# Patient Record
Sex: Female | Born: 1950 | Race: White | Hispanic: No | Marital: Married | Smoking: Never smoker
Health system: Southern US, Community
[De-identification: ages and names within clinical notes are randomized; demographics above are authoritative.]

## PROBLEM LIST (undated history)

## (undated) DIAGNOSIS — I10 Essential (primary) hypertension: Secondary | ICD-10-CM

## (undated) DIAGNOSIS — M790 Rheumatism, unspecified: Secondary | ICD-10-CM

## (undated) DIAGNOSIS — J45909 Unspecified asthma, uncomplicated: Secondary | ICD-10-CM

## (undated) DIAGNOSIS — M1990 Unspecified osteoarthritis, unspecified site: Secondary | ICD-10-CM

---

## 2015-12-02 ENCOUNTER — Encounter (HOSPITAL_COMMUNITY): Payer: Self-pay | Admitting: Student

## 2015-12-02 ENCOUNTER — Inpatient Hospital Stay (HOSPITAL_COMMUNITY): Payer: Medicare Other

## 2015-12-02 ENCOUNTER — Inpatient Hospital Stay (HOSPITAL_COMMUNITY)
Admission: AD | Admit: 2015-12-02 | Discharge: 2015-12-02 | Disposition: A | Discharge status: Home / Self Care | Payer: Medicare Other | Source: Ambulatory Visit | Attending: Emergency Medicine | Admitting: Emergency Medicine

## 2015-12-02 DIAGNOSIS — Z79899 Other long term (current) drug therapy: Secondary | ICD-10-CM | POA: Insufficient documentation

## 2015-12-02 DIAGNOSIS — J45901 Unspecified asthma with (acute) exacerbation: Secondary | ICD-10-CM | POA: Insufficient documentation

## 2015-12-02 DIAGNOSIS — R079 Chest pain, unspecified: Secondary | ICD-10-CM | POA: Diagnosis present

## 2015-12-02 DIAGNOSIS — M1990 Unspecified osteoarthritis, unspecified site: Secondary | ICD-10-CM | POA: Insufficient documentation

## 2015-12-02 DIAGNOSIS — I10 Essential (primary) hypertension: Secondary | ICD-10-CM | POA: Diagnosis not present

## 2015-12-02 DIAGNOSIS — Z7951 Long term (current) use of inhaled steroids: Secondary | ICD-10-CM | POA: Diagnosis not present

## 2015-12-02 HISTORY — DX: Essential (primary) hypertension: I10

## 2015-12-02 HISTORY — DX: Unspecified asthma, uncomplicated: J45.909

## 2015-12-02 HISTORY — DX: Unspecified osteoarthritis, unspecified site: M19.90

## 2015-12-02 HISTORY — DX: Rheumatism, unspecified: M79.0

## 2015-12-02 LAB — BASIC METABOLIC PANEL
ANION GAP: 14 (ref 5–15)
BUN: 7 mg/dL (ref 6–20)
CHLORIDE: 103 mmol/L (ref 101–111)
CO2: 24 mmol/L (ref 22–32)
Calcium: 9.2 mg/dL (ref 8.9–10.3)
Creatinine, Ser: 1.06 mg/dL — ABNORMAL HIGH (ref 0.44–1.00)
GFR calc non Af Amer: 53 mL/min — ABNORMAL LOW (ref >60)
GLUCOSE: 105 mg/dL — AB (ref 65–99)
POTASSIUM: 3.7 mmol/L (ref 3.5–5.1)
Sodium: 141 mmol/L (ref 135–145)

## 2015-12-02 LAB — CBC WITH DIFFERENTIAL/PLATELET
BASOS ABS: 0 10*3/uL (ref 0.0–0.1)
BASOS PCT: 1 %
Eosinophils Absolute: 0.1 10*3/uL (ref 0.0–0.7)
Eosinophils Relative: 2 %
HEMATOCRIT: 38.5 % (ref 36.0–46.0)
HEMOGLOBIN: 12.3 g/dL (ref 12.0–15.0)
LYMPHS ABS: 0.8 10*3/uL (ref 0.7–4.0)
LYMPHS PCT: 16 %
MCH: 27.7 pg (ref 26.0–34.0)
MCHC: 31.9 g/dL (ref 30.0–36.0)
MCV: 86.7 fL (ref 78.0–100.0)
MONO ABS: 0.6 10*3/uL (ref 0.1–1.0)
MONOS PCT: 11 %
NEUTROS ABS: 3.5 10*3/uL (ref 1.7–7.7)
Neutrophils Relative %: 70 %
Platelets: 235 10*3/uL (ref 150–400)
RBC: 4.44 MIL/uL (ref 3.87–5.11)
RDW: 14.3 % (ref 11.5–15.5)
WBC: 5 10*3/uL (ref 4.0–10.5)

## 2015-12-02 LAB — I-STAT TROPONIN, ED
TROPONIN I, POC: 0 ng/mL (ref 0.00–0.08)
Troponin i, poc: 0 ng/mL (ref 0.00–0.08)

## 2015-12-02 MED ORDER — NITROGLYCERIN 0.4 MG SL SUBL
0.4000 mg | SUBLINGUAL_TABLET | SUBLINGUAL | Status: DC | PRN
  Administered 2015-12-02 (×2): 0.4 mg via SUBLINGUAL
  Filled 2015-12-02: qty 1

## 2015-12-02 MED ORDER — ASPIRIN 81 MG PO CHEW
324.0000 mg | CHEWABLE_TABLET | Freq: Once | ORAL | Status: AC
  Administered 2015-12-02: 324 mg via ORAL
  Filled 2015-12-02: qty 4

## 2015-12-02 MED ORDER — SODIUM CHLORIDE 0.9 % IV SOLN: 999.0000 mL | INTRAVENOUS | Status: DC

## 2015-12-02 MED ORDER — CLOPIDOGREL BISULFATE 75 MG PO TABS
300.0000 mg | ORAL_TABLET | Freq: Once | ORAL | Status: AC
  Administered 2015-12-02: 300 mg via ORAL
  Filled 2015-12-02: qty 4

## 2015-12-02 NOTE — Discharge Instructions (Signed)
Please read and follow all provided instructions.  Your diagnoses today include:  1. Chest pain, unspecified chest pain type     Tests performed today include:  An EKG of your heart  A chest x-ray  Cardiac enzymes - a blood test for heart muscle damage  Blood counts and electrolytes  Vital signs. See below for your results today.   Medications prescribed:   None  Take any prescribed medications only as directed.  Follow-up instructions: Please follow-up with your primary care provider as soon as you can for further evaluation of your symptoms.   Return instructions:  SEEK IMMEDIATE MEDICAL ATTENTION IF:  You have severe chest pain, especially if the pain is crushing or pressure-like and spreads to the arms, back, neck, or jaw, or if you have sweating, nausea (feeling sick to your stomach), or shortness of breath. THIS IS AN EMERGENCY. Don't wait to see if the pain will go away. Get medical help at once. Call 911 or 0 (operator). DO NOT drive yourself to the hospital.   Your chest pain gets worse and does not go away with rest.   You have an attack of chest pain lasting longer than usual, despite rest and treatment with the medications your caregiver has prescribed.   You wake from sleep with chest pain or shortness of breath.  You feel dizzy or faint.  You have chest pain not typical of your usual pain for which you originally saw your caregiver.   You have any other emergent concerns regarding your health.  Additional Information: Chest pain comes from many different causes. Your caregiver has diagnosed you as having chest pain that is not specific for one problem, but does not require admission.  You are at low risk for an acute heart condition or other serious illness.   Your vital signs today were: BP 135/68 mmHg   Pulse 61   Temp(Src) 98.8 F (37.1 C) (Oral)   Resp 12   SpO2 95% If your blood pressure (BP) was elevated above 135/85 this visit, please have this  repeated by your doctor within one month. --------------

## 2015-12-02 NOTE — MAU Provider Note (Signed)
History     CSN: [id number]  Arrival date and time: 12/02/15 1456   None        Chief Complaint  Patient presents with  . Chest Pain   HPI Comments: Marissa LarsenBarbara Yates is a 65 y.o. Female who presents for chest pain. Reports feeling of heartburn 2 days ago. Since this morning feels like has "heaviness" on chest with discomfort that radiates down left arm and under her chin.   Chest Pain  This is a new problem. The current episode started today. The onset quality is gradual. The problem occurs constantly. The problem has been gradually worsening. The pain is present in the substernal region. The quality of the pain is described as pressure ("heavy"). The pain radiates to the left arm (under chin). Associated symptoms include shortness of breath (on exertion). Pertinent negatives include no abdominal pain, cough, diaphoresis, dizziness, fever, headaches, irregular heartbeat, leg pain, nausea, near-syncope, numbness, palpitations or syncope. She has tried antacids for the symptoms. The treatment provided no relief. Risk factors include being elderly.  Her past medical history is significant for hypertension.  Pertinent negatives for past medical history include no congenital heart disease, no diabetes and no MI. Prior diagnostic workup includes echocardiogram.       Past Medical History  Diagnosis Date  . Asthma   . Arthritis   . Hypertension   . Rheumatism     left wrist, left hand, right wrist    No past surgical history on file.  No family history on file.  Social History  Substance Use Topics  . Smoking status: Not on file  . Smokeless tobacco: Not on file  . Alcohol Use: Not on file    Allergies:  Allergies  Allergen Reactions  . Iodine     IV for stress test    Prescriptions prior to admission  Medication Sig Dispense Refill Last Dose  . acetaminophen (TYLENOL) 500 MG tablet Take 500 mg by mouth every 6 (six) hours as needed.     Marland Kitchen. atenolol (TENORMIN) 50 MG  tablet Take 50 mg by mouth daily.     . famotidine (PEPCID) 20 MG tablet Take 20 mg by mouth 2 (two) times daily.     . fluticasone (FLOVENT DISKUS) 50 MCG/BLIST diskus inhaler Inhale 2 puffs into the lungs 2 (two) times daily.     . hydroxychloroquine (PLAQUENIL) 200 MG tablet Take 200 mg by mouth daily.     Marland Kitchen. ibuprofen (ADVIL,MOTRIN) 600 MG tablet Take 600 mg by mouth every 6 (six) hours as needed.     . leflunomide (ARAVA) 20 MG tablet Take 20 mg by mouth daily.     Marland Kitchen. loratadine (CLARITIN) 10 MG tablet Take 10 mg by mouth daily.     Marland Kitchen. omeprazole (PRILOSEC) 20 MG capsule Take 20 mg by mouth daily.     . sucralfate (CARAFATE) 1 g tablet Take 1 g by mouth 4 (four) times daily -  with meals and at bedtime.       Review of Systems  Constitutional: Negative for fever and diaphoresis.  Respiratory: Positive for shortness of breath (on exertion). Negative for cough.   Cardiovascular: Positive for chest pain. Negative for palpitations, syncope and near-syncope.  Gastrointestinal: Negative for nausea and abdominal pain.  Neurological: Negative for dizziness, numbness and headaches.   Physical Exam   Blood pressure 171/83, pulse 66, resp. rate 20, SpO2 97 %.  Patient Vitals for the past 24 hrs:  BP Pulse Resp SpO2  12/02/15 1530 150/83 mmHg 66 20 98 %  12/02/15 1526 150/68 mmHg 68 20 96 %  12/02/15 1506 171/83 mmHg 66 20 97 %  12/02/15 1505 - - - 95 %     Physical Exam  Nursing note and vitals reviewed. Constitutional: She is oriented to person, place, and time. She appears well-developed and well-nourished. No distress.  HENT:  Head: Normocephalic and atraumatic.  Eyes: Conjunctivae are normal. Right eye exhibits no discharge. Left eye exhibits no discharge. No scleral icterus.  Neck: Normal range of motion.  Cardiovascular: Normal rate, regular rhythm and normal heart sounds.   No murmur heard. Respiratory: Effort normal and breath sounds normal. No respiratory distress. She has no  wheezes.  Neurological: She is alert and oriented to person, place, and time.  Skin: Skin is warm and dry. She is not diaphoretic.  Psychiatric: She has a normal mood and affect. Her behavior is normal. Judgment and thought content normal.    MAU Course  Procedures No results found for this or any previous visit (from the past 24 hour(s)).  MDM Chest pain protocol initiated Report called to Dr. Denton Lank at [facility] for pt to be transferred [facility] not available at this time. [facility] called to transfer patient  Assessment and Plan  A:  1. Chest pain, unspecified chest pain type     P: Transfer to [facility] via EMS  Judeth Horn 12/02/2015, 3:09 PM

## 2015-12-02 NOTE — ED Notes (Signed)
[facility] [place], [place] @ [phone]

## 2015-12-02 NOTE — ED Notes (Signed)
[facility]- [facility] transported [facility]/o chest pain. Pt took 324mg  ASA, 300mg  Plavix, 2 nitro. 18G L hand, vitals stable.

## 2015-12-02 NOTE — MAU Note (Signed)
Urine sent to lab 

## 2015-12-02 NOTE — MAU Note (Addendum)
Hx of 3 stress tests, all neg for cardiac problems, same symptoms last March- had stress test, cardiac cath- was dx with reflux.  Has had chest pain off and on last couple days- went to Urgent care, thought it was reflux was given medicines.  Is now having pain in left shoulder and chest.

## 2015-12-02 NOTE — ED Provider Notes (Signed)
CSN: [id number]     Arrival date & time 12/02/15  1456 History   First MD Initiated Contact with Patient 12/02/15 1616     Chief Complaint  Patient presents with  . Chest Pain     (Consider location/radiation/quality/duration/timing/severity/associated sxs/prior Treatment) HPI Comments: Patient with history of GERD, high blood pressure -- presents today with chest pain. Patient states that she has been having a burning sensation in her chest similar to previous GERD for the past several days. Symptoms have been intermittent. Yesterday she was feeling well. Today symptoms returned however it felt more like a pressure in the middle of her chest and she also has some pain that radiated to her left shoulder. No vomiting, diaphoresis. Symptoms are not made worse with exertion. She denies significant shortness of breath. No lower extremity swelling. Patient went to MAU for evaluation and was transferred ordered to the emergency department. Patient received nitroglycerin and aspirin. Nitroglycerin seemed to make her symptoms better, and she is currently pain free. Patient notes that she was admitted to a hospital in [place], [place] one year ago. At that time she had a cardiac catheterization and did not need any interventions. No history of high cholesterol, diabetes, smoking, family history of CAD.  Patient is a 65 y.o. female presenting with chest pain. The history is provided by the patient.  Chest Pain Associated symptoms: no abdominal pain, no back pain, no cough, no diaphoresis, no fever, no nausea, no palpitations, no shortness of breath and not vomiting     Past Medical History  Diagnosis Date  . Asthma   . Arthritis   . Hypertension   . Rheumatism     left wrist, left hand, right wrist   No past surgical history on file. No family history on file. Social History  Substance Use Topics  . Smoking status: None  . Smokeless tobacco: None  . Alcohol Use: None   OB History    No  data available     Review of Systems  Constitutional: Negative for fever and diaphoresis.  Eyes: Negative for redness.  Respiratory: Negative for cough and shortness of breath.   Cardiovascular: Positive for chest pain. Negative for palpitations and leg swelling.  Gastrointestinal: Negative for nausea, vomiting and abdominal pain.  Genitourinary: Negative for dysuria.  Musculoskeletal: Negative for back pain and neck pain.  Skin: Negative for rash.  Neurological: Negative for syncope and light-headedness.  Psychiatric/Behavioral: The patient is not nervous/anxious.     Allergies  Iodine  Home Medications   Prior to Admission medications   Medication Sig Start Date End Date Taking? Authorizing Provider  acetaminophen (TYLENOL) 500 MG tablet Take 500 mg by mouth every 6 (six) hours as needed.   Yes Historical Provider, MD  atenolol (TENORMIN) 50 MG tablet Take 50 mg by mouth daily.   Yes Historical Provider, MD  famotidine (PEPCID) 20 MG tablet Take 20 mg by mouth 2 (two) times daily.   Yes Historical Provider, MD  fluticasone (FLOVENT DISKUS) 50 MCG/BLIST diskus inhaler Inhale 2 puffs into the lungs 2 (two) times daily.   Yes Historical Provider, MD  hydroxychloroquine (PLAQUENIL) 200 MG tablet Take 200 mg by mouth daily.   Yes Historical Provider, MD  ibuprofen (ADVIL,MOTRIN) 600 MG tablet Take 600 mg by mouth every 6 (six) hours as needed.   Yes Historical Provider, MD  leflunomide (ARAVA) 20 MG tablet Take 20 mg by mouth daily.   Yes Historical Provider, MD  loratadine (CLARITIN) 10 MG  tablet Take 10 mg by mouth daily.   Yes Historical Provider, MD  omeprazole (PRILOSEC) 20 MG capsule Take 20 mg by mouth daily.   Yes Historical Provider, MD  sucralfate (CARAFATE) 1 g tablet Take 1 g by mouth 4 (four) times daily -  with meals and at bedtime.   Yes Historical Provider, MD   BP 136/71 mmHg  Pulse 63  Temp(Src) 98.8 F (37.1 C) (Oral)  Resp 19  SpO2 98%   Physical Exam   Constitutional: She appears well-developed and well-nourished.  HENT:  Head: Normocephalic and atraumatic.  Mouth/Throat: Oropharynx is clear and moist and mucous membranes are normal. Mucous membranes are not dry.  Eyes: Conjunctivae are normal.  Neck: Trachea normal and normal range of motion. Neck supple. Normal carotid pulses and no JVD present. No muscular tenderness present. Carotid bruit is not present. No tracheal deviation present.  Cardiovascular: Normal rate, regular rhythm, S1 normal, S2 normal, normal heart sounds and intact distal pulses.  Exam reveals no decreased pulses.   No murmur heard. Pulmonary/Chest: Effort normal. No respiratory distress. She has no wheezes. She exhibits no tenderness.  Abdominal: Soft. Normal aorta and bowel sounds are normal. There is no tenderness. There is no rebound and no guarding.  Musculoskeletal: Normal range of motion.  Neurological: She is alert.  Skin: Skin is warm and dry. She is not diaphoretic. No cyanosis. No pallor.  Psychiatric: She has a normal mood and affect.  Nursing note and vitals reviewed.   ED Course  Procedures (including critical care time) Labs Review Labs Reviewed  BASIC METABOLIC PANEL - Abnormal; Notable for the following:    Glucose, Bld 105 (*)    Creatinine, Ser 1.06 (*)    GFR calc non Af Amer 53 (*)    All other components within normal limits  CBC WITH DIFFERENTIAL/PLATELET  I-STAT TROPOININ, ED  I-STAT TROPOININ, ED    Imaging Review No results found. I have personally reviewed and evaluated these images and lab results as part of my medical decision-making.   EKG Interpretation   Date/Time:  Friday December 02 2015 16:13:18 EST Ventricular Rate:  64 PR Interval:  120 QRS Duration: 126 QT Interval:  471 QTC Calculation: 486 R Axis:   19 Text Interpretation:  Sinus rhythm Probable lateral infarct, old Baseline  wander in lead(s) V1 V2 V3 V6 Artifact Confirmed by Anitra Lauth  MD, Alphonzo Lemmings  [phone])  on 12/02/2015 4:20:09 PM       4:32 PM Patient seen and examined. EKG reviewed with Dr. Anitra Lauth. Will try to get records from [place], specifically cath report.    Vital signs reviewed and are as follows: BP 136/71 mmHg  Pulse 63  Temp(Src) 98.8 F (37.1 C) (Oral)  Resp 19  SpO2 98%  8:32 PM Patient discussed with and seen by Dr. Anitra Lauth earlier.   2nd marker neg. Records from [place] show normal coronaries in 10/2014. Patient and family updated. Will discharge. Encouraged PCP follow-up.   Patient was counseled to return with severe chest pain, especially if the pain is crushing or pressure-like and spreads to the arms, back, neck, or jaw, or if they have sweating, nausea, or shortness of breath with the pain. They were encouraged to call 911 with these symptoms.   They were also told to return if their chest pain gets worse and does not go away with rest, they have an attack of chest pain lasting longer than usual despite rest and treatment with the medications their caregiver  has prescribed, if they wake from sleep with chest pain or shortness of breath, if they feel dizzy or faint, if they have chest pain not typical of their usual pain, or if they have any other emergent concerns regarding their health.  The patient verbalized understanding and agreed.     MDM   Final diagnoses:  Chest pain, unspecified chest pain type   Patient with chest pain. Normal cath last year. No clinical or objective signs of heart failure. Possibly related to GERD. Feel patient is low risk for ACS given history (poor story for ACS/MI), negative troponin(s), EKG.      Renne CriglerJoshua Lexis Potenza, PA-C 12/02/15 2034  Gwyneth SproutWhitney Plunkett, MD 12/03/15 [phone]

## 2016-11-27 IMAGING — DX DG CHEST 2V
2 series · 2 of 2 positions shown · non-contrast
Comparison: None in PACs

CLINICAL DATA: Burning chest pain radiating into the left shoulder
for the past 2 days.

EXAM:
CHEST  2 VIEW

[w chest pa]
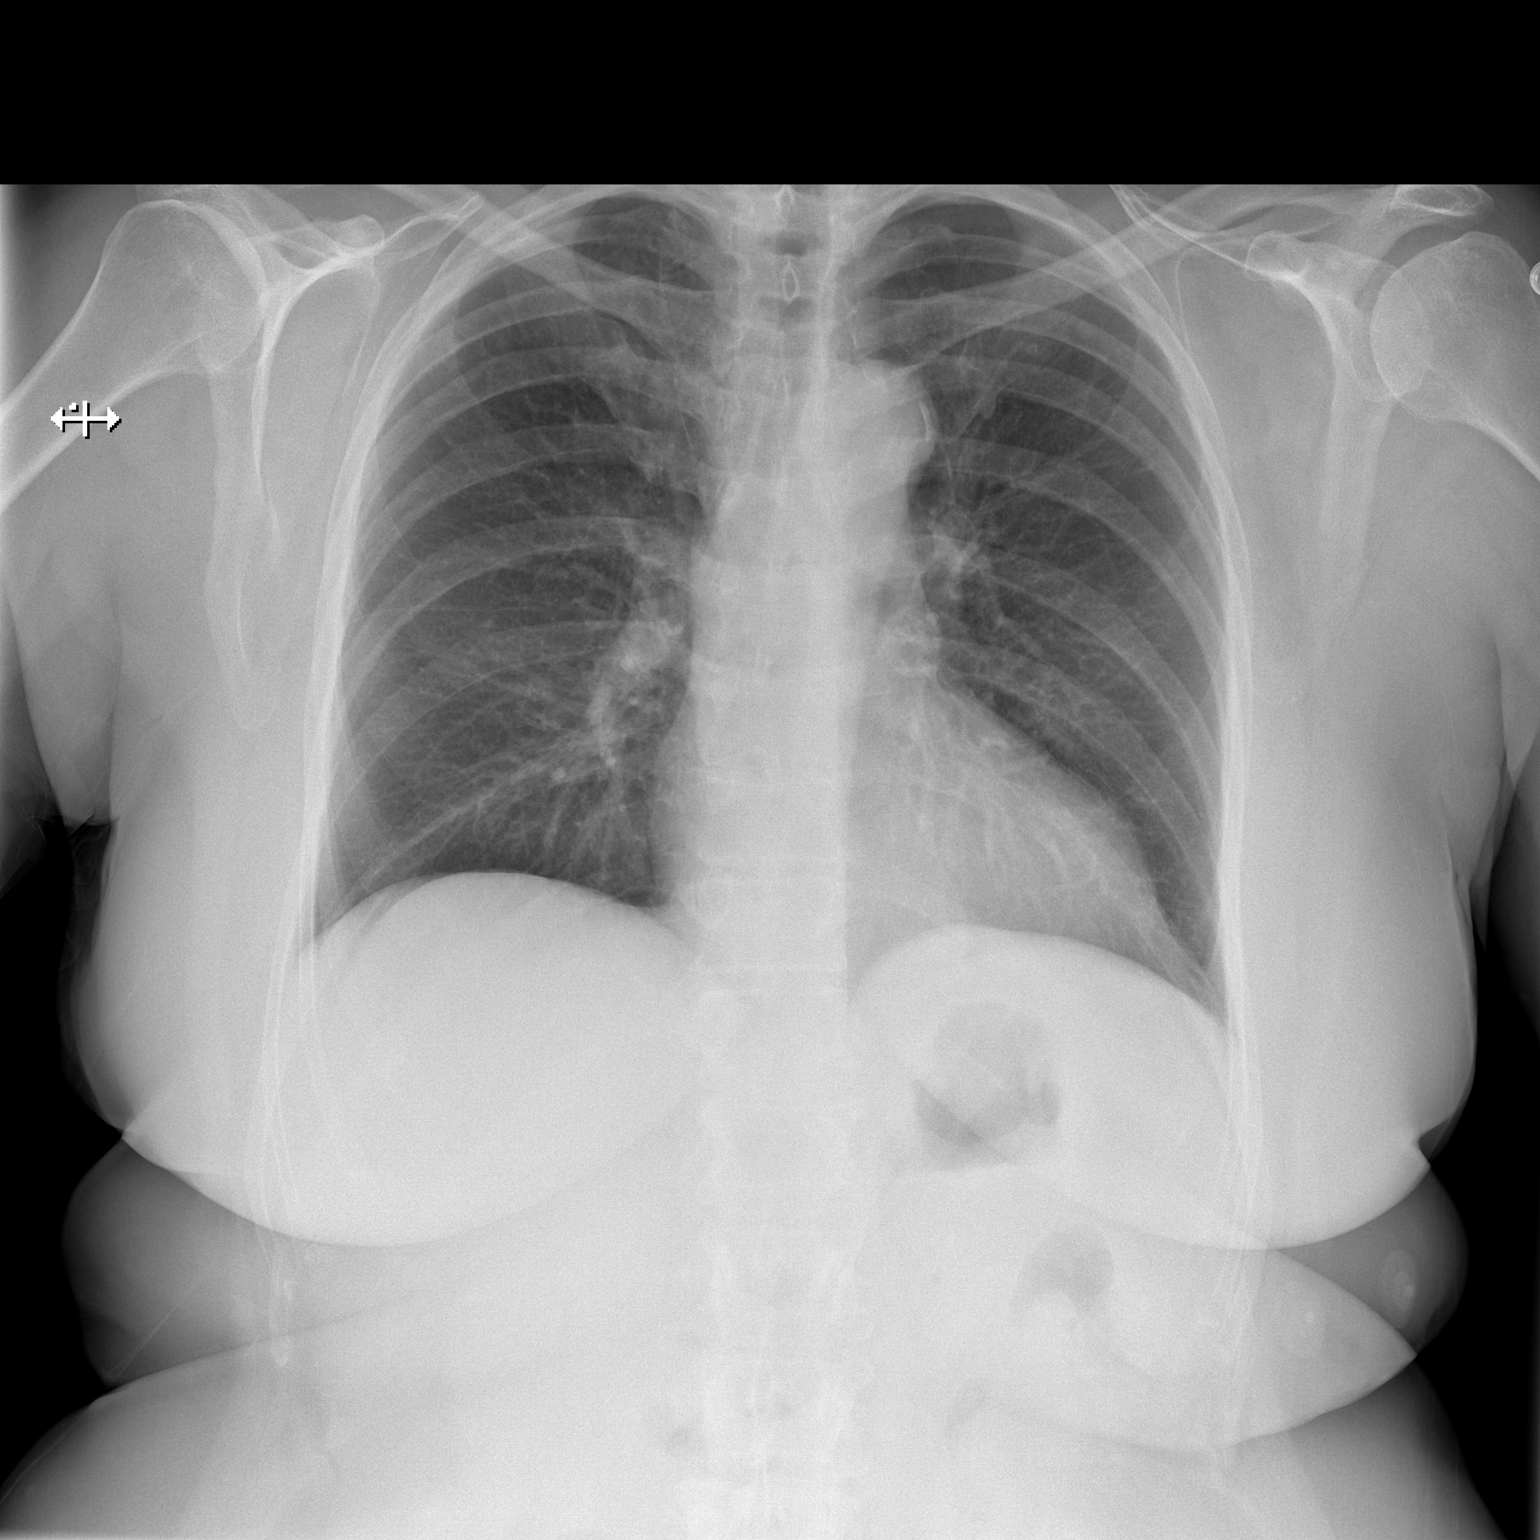

[w chest lat]
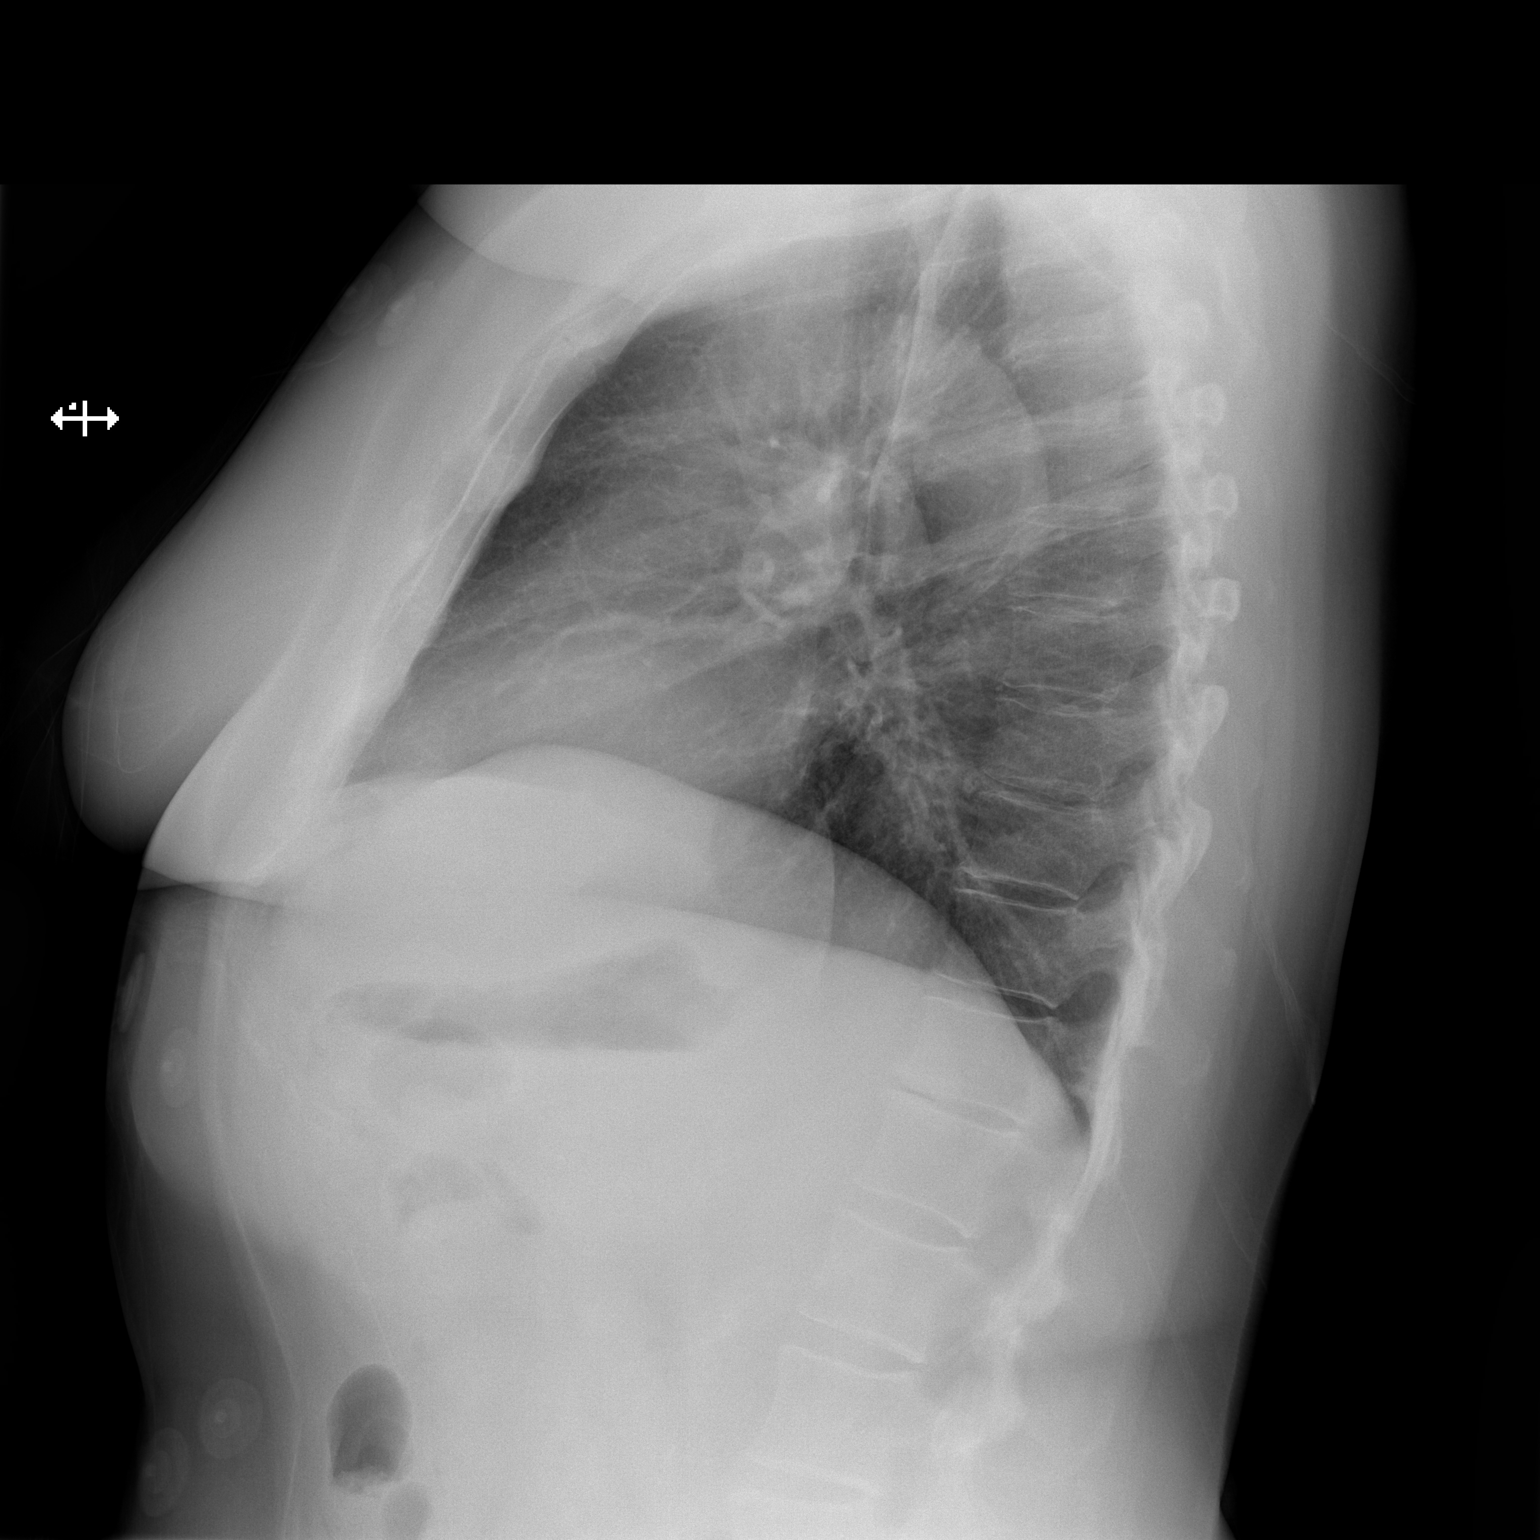

[2 of 2 positions shown; findings below may reference images not displayed]

FINDINGS: The lungs are adequately inflated and clear. There is no
pneumothorax, pneumomediastinum, or pleural effusion. The heart and
pulmonary vascularity are normal. The mediastinum is normal in
width. The bony thorax exhibits no acute abnormality.
IMPRESSION: There is no evidence of CHF nor other acute cardiopulmonary
abnormality.

## 2018-11-14 ENCOUNTER — Ambulatory Visit
Admission: EM | Admit: 2018-11-14 | Discharge: 2018-11-14 | Disposition: A | Discharge status: Home / Self Care | Payer: Medicare Other | Attending: Family Medicine | Admitting: Family Medicine

## 2018-11-14 ENCOUNTER — Encounter: Payer: Self-pay | Admitting: Emergency Medicine

## 2018-11-14 ENCOUNTER — Other Ambulatory Visit: Payer: Self-pay

## 2018-11-14 DIAGNOSIS — R600 Localized edema: Secondary | ICD-10-CM | POA: Diagnosis not present

## 2018-11-14 NOTE — ED Provider Notes (Signed)
[facility]    CSN: [id number] Arrival date & time: 11/14/18  1434  History   Chief Complaint Chief Complaint  Patient presents with  . Neck Pain  . Leg Swelling   HPI  Marissa Yates presents with the above complaints.  Patient has had ongoing swelling in the lower extremities.  Patient states that this started after she recently began Humira and amlodipine.  Patient reports swelling of the lower extremities.  Denies redness or significant pain.  Is been worse over the past 3 days.  He is also had some recent neck stiffness.  This has improved slightly with use of ibuprofen.  No fall, trauma, injury.  No other associated symptoms.  No other complaints.  Past Medical History:  Diagnosis Date  . Arthritis   . Asthma   . Hypertension   . Rheumatism    left wrist, left hand, right wrist   History reviewed. No pertinent surgical history.  OB History   No obstetric history on file.    Home Medications    Prior to Admission medications   Medication Sig Start Date End Date Taking? Authorizing Provider  Adalimumab (HUMIRA) 40 MG/0.4ML PSKT Inject into the skin.   Yes [provider]  amLODipine (NORVASC) 5 MG tablet Take 5 mg by mouth 2 (two) times daily.   Yes [provider]  atenolol (TENORMIN) 50 MG tablet Take 50 mg by mouth daily.   Yes [provider]  loratadine (CLARITIN) 10 MG tablet Take 10 mg by mouth daily.   Yes [provider]  omeprazole (PRILOSEC) 20 MG capsule Take 20 mg by mouth daily.   Yes [provider]  predniSONE (DELTASONE) 10 MG tablet TK 2 TS PO D FOR 5 DAYS THEN 1 T D FOR 5 DAYS 10/29/18  Yes [provider]  acetaminophen (TYLENOL) 500 MG tablet Take 500 mg by mouth every 6 (six) hours as needed for moderate pain.     [provider]  fluticasone (FLOVENT DISKUS) 50 MCG/BLIST diskus inhaler Inhale 2 puffs into the lungs 2 (two) times daily.    [provider]    hydroxychloroquine (PLAQUENIL) 200 MG tablet Take 200 mg by mouth 2 (two) times daily.     [provider]  ibuprofen (ADVIL,MOTRIN) 600 MG tablet Take 600 mg by mouth every 6 (six) hours as needed for moderate pain.     [provider]  leflunomide (ARAVA) 20 MG tablet Take 20 mg by mouth daily.    [provider]    Family History History reviewed. No pertinent family history.  Social History Social History   Tobacco Use  . Smoking status: Never Smoker  . Smokeless tobacco: Never Used  Substance Use Topics  . Alcohol use: No  . Drug use: Never     Allergies   Iodine and Methotrexate derivatives   Review of Systems Review of Systems  Respiratory: Negative.   Cardiovascular:       Leg/ankle swelling.    Physical Exam Triage Vital Signs ED Triage Vitals  Enc Vitals Group     BP 11/14/18 1450 (!) 148/73     Pulse Rate 11/14/18 1450 60     Resp 11/14/18 1450 16     Temp 11/14/18 1450 98 F (36.7 C)     Temp Source 11/14/18 1450 Oral     SpO2 11/14/18 1450 98 %     Weight 11/14/18 1446 185 lb (83.9 kg)     Height 11/14/18  1446 5\' 5"  (1.651 m)     Head Circumference --      Peak Flow --      Pain Score 11/14/18 1446 4     Pain Loc --      Pain Edu? --      Excl. in GC? --    Updated Vital Signs BP (!) 148/73 (BP Location: Left Arm)   Pulse 60   Temp 98 F (36.7 C) (Oral)   Resp 16   Ht 5\' 5"  (1.651 m)   Wt 83.9 kg   SpO2 98%   BMI 30.79 kg/m   Visual Acuity Right Eye Distance:   Left Eye Distance:   Bilateral Distance:    Right Eye Near:   Left Eye Near:    Bilateral Near:     Physical Exam Vitals signs and nursing note reviewed.  Constitutional:      General: She is not in acute distress.    Appearance: Normal appearance.  HENT:     Head: Normocephalic and atraumatic.  Eyes:     General:        Right eye: No discharge.        Left eye: No discharge.     Conjunctiva/sclera: Conjunctivae normal.  Cardiovascular:      Rate and Rhythm: Normal rate and regular rhythm.     Comments: 2+ pitting foot and ankle edema. Pulmonary:     Effort: Pulmonary effort is normal.     Breath sounds: Normal breath sounds.  Neurological:     Mental Status: She is alert.  Psychiatric:        Mood and Affect: Mood normal.        Behavior: Behavior normal.    UC Treatments / Results  Labs (all labs ordered are listed, but only abnormal results are displayed) Labs Reviewed - No data to display  EKG None  Radiology No results found.  Procedures Procedures (including critical care time)  Medications Ordered in UC Medications - No data to display  Initial Impression / Assessment and Plan / UC Course  I have reviewed the triage vital signs and the nursing notes.  Pertinent labs & imaging results that were available during my care of the patient were reviewed by me and considered in my medical decision making (see chart for details).    Marissa Yates presents with leg edema.  This appears to be from Norvasc.  Advised to stop and monitor her blood pressure or wait until she discusses this with her primary care physician.  Patient neck pain seems to have improved with NSAIDs.  Advised supportive care.  Final Clinical Impressions(s) / UC Diagnoses   Final diagnoses:  Leg edema     Discharge Instructions     Swelling likely from the amlodipine.  Discuss stopping the amlodipine with your PCP.  Take care  Dr. Adriana Simas    ED Prescriptions    None     Controlled Substance Prescriptions [place] Controlled Substance Registry consulted? Not Applicable   Tommie Sams, DO 11/14/18 [phone]

## 2018-11-14 NOTE — Discharge Instructions (Signed)
Swelling likely from the amlodipine.  Discuss stopping the amlodipine with your PCP.  Take care  Dr. Adriana Simas

## 2018-11-14 NOTE — ED Triage Notes (Signed)
Patient c/o swelling in both lower legs and feet for the past 3 days.  Patient also reports pain and stiffness in her neck that started around 12pm today.
